# Patient Record
Sex: Male | Born: 1985 | Race: White | Hispanic: No | Marital: Single | Smoking: Former smoker
Health system: Southern US, Community
[De-identification: ages and names within clinical notes are randomized; demographics above are authoritative.]

---

## 2004-11-11 ENCOUNTER — Emergency Department (HOSPITAL_COMMUNITY): Admission: EM | Admit: 2004-11-11 | Discharge: 2004-11-11 | Payer: Self-pay | Admitting: Emergency Medicine

## 2008-12-04 ENCOUNTER — Emergency Department (HOSPITAL_COMMUNITY): Admission: EM | Admit: 2008-12-04 | Discharge: 2008-12-04 | Payer: Self-pay | Admitting: Emergency Medicine

## 2015-04-09 ENCOUNTER — Encounter (HOSPITAL_COMMUNITY): Payer: Self-pay | Admitting: Emergency Medicine

## 2015-04-09 ENCOUNTER — Emergency Department (HOSPITAL_COMMUNITY): Payer: BLUE CROSS/BLUE SHIELD

## 2015-04-09 ENCOUNTER — Emergency Department (HOSPITAL_COMMUNITY)
Admission: EM | Admit: 2015-04-09 | Discharge: 2015-04-09 | Disposition: A | Discharge status: Home / Self Care | Payer: BLUE CROSS/BLUE SHIELD | Attending: Emergency Medicine | Admitting: Emergency Medicine

## 2015-04-09 DIAGNOSIS — R000 Tachycardia, unspecified: Secondary | ICD-10-CM | POA: Insufficient documentation

## 2015-04-09 DIAGNOSIS — R55 Syncope and collapse: Secondary | ICD-10-CM | POA: Diagnosis not present

## 2015-04-09 DIAGNOSIS — R51 Headache: Secondary | ICD-10-CM | POA: Diagnosis not present

## 2015-04-09 DIAGNOSIS — R739 Hyperglycemia, unspecified: Secondary | ICD-10-CM | POA: Diagnosis not present

## 2015-04-09 DIAGNOSIS — E876 Hypokalemia: Secondary | ICD-10-CM | POA: Diagnosis not present

## 2015-04-09 DIAGNOSIS — R42 Dizziness and giddiness: Secondary | ICD-10-CM | POA: Diagnosis present

## 2015-04-09 DIAGNOSIS — Z87891 Personal history of nicotine dependence: Secondary | ICD-10-CM | POA: Diagnosis not present

## 2015-04-09 DIAGNOSIS — R569 Unspecified convulsions: Secondary | ICD-10-CM | POA: Insufficient documentation

## 2015-04-09 LAB — CBG MONITORING, ED: Glucose-Capillary: 74 mg/dL (ref 65–99)

## 2015-04-09 LAB — ACETAMINOPHEN LEVEL: Acetaminophen (Tylenol), Serum: <10 ug/mL — ABNORMAL LOW (ref 10–30)

## 2015-04-09 LAB — CBC
HCT: 44.1 % (ref 39.0–52.0)
HEMOGLOBIN: 15.6 g/dL (ref 13.0–17.0)
MCH: 29.5 pg (ref 26.0–34.0)
MCHC: 35.4 g/dL (ref 30.0–36.0)
MCV: 83.4 fL (ref 78.0–100.0)
Platelets: 257 10*3/uL (ref 150–400)
RBC: 5.29 MIL/uL (ref 4.22–5.81)
RDW: 13 % (ref 11.5–15.5)
WBC: 9.5 10*3/uL (ref 4.0–10.5)

## 2015-04-09 LAB — BASIC METABOLIC PANEL
ANION GAP: 10 (ref 5–15)
BUN: 9 mg/dL (ref 6–20)
CHLORIDE: 103 mmol/L (ref 101–111)
CO2: 25 mmol/L (ref 22–32)
CREATININE: 0.96 mg/dL (ref 0.61–1.24)
Calcium: 9.1 mg/dL (ref 8.9–10.3)
GFR calc Af Amer: >60 mL/min (ref >60)
GFR calc non Af Amer: >60 mL/min (ref >60)
Glucose, Bld: 145 mg/dL — ABNORMAL HIGH (ref 65–99)
POTASSIUM: 3.3 mmol/L — AB (ref 3.5–5.1)
SODIUM: 138 mmol/L (ref 135–145)

## 2015-04-09 LAB — URINALYSIS, ROUTINE W REFLEX MICROSCOPIC
Bilirubin Urine: NEGATIVE
GLUCOSE, UA: NEGATIVE mg/dL
Ketones, ur: NEGATIVE mg/dL
LEUKOCYTES UA: NEGATIVE
NITRITE: NEGATIVE
PH: 6 (ref 5.0–8.0)
Protein, ur: 100 mg/dL — AB
Urobilinogen, UA: 0.2 mg/dL (ref 0.0–1.0)

## 2015-04-09 LAB — RAPID URINE DRUG SCREEN, HOSP PERFORMED
AMPHETAMINES: NOT DETECTED
Barbiturates: NOT DETECTED
Benzodiazepines: NOT DETECTED
COCAINE: NOT DETECTED
OPIATES: NOT DETECTED
TETRAHYDROCANNABINOL: NOT DETECTED

## 2015-04-09 LAB — URINE MICROSCOPIC-ADD ON

## 2015-04-09 LAB — SALICYLATE LEVEL: Salicylate Lvl: <4 mg/dL (ref 2.8–30.0)

## 2015-04-09 LAB — D-DIMER, QUANTITATIVE (NOT AT ARMC)

## 2015-04-09 LAB — TROPONIN I: Troponin I: <0.03 ng/mL (ref <0.031)

## 2015-04-09 MED ORDER — SODIUM CHLORIDE 0.9 % IV BOLUS (SEPSIS)
1000.0000 mL | Freq: Once | INTRAVENOUS | Status: AC
Start: 2015-04-09 — End: 2015-04-09
  Administered 2015-04-09: 1000 mL via INTRAVENOUS

## 2015-04-09 MED ORDER — POTASSIUM CHLORIDE CRYS ER 20 MEQ PO TBCR
40.0000 meq | EXTENDED_RELEASE_TABLET | Freq: Once | ORAL | Status: AC
  Administered 2015-04-09: 40 meq via ORAL
  Filled 2015-04-09: qty 2

## 2015-04-09 MED ORDER — SODIUM CHLORIDE 0.9 % IV BOLUS (SEPSIS)
1000.0000 mL | Freq: Once | INTRAVENOUS | Status: AC
  Administered 2015-04-09: 1000 mL via INTRAVENOUS

## 2015-04-09 NOTE — ED Provider Notes (Addendum)
CSN: [id number]     Arrival date & time 04/09/15  1701 History   First MD Initiated Contact with Patient 04/09/15 1722     Chief Complaint  Patient presents with  . Loss of Consciousness     (Consider location/radiation/quality/duration/timing/severity/associated sxs/prior Treatment) HPI Comments: Patient from home with possible seizure-like episode. Patient states she remembers going to the  bathroom to have a bowel movement. He states he was straining on the toilet when he began to have blurry vision and feel lightheaded. He knows he was laying in the bed in his mother's room. His mother heard him fall in the bathroom and said he was shaking all over for about 10 minutes and was minimally responding to her not following commands. There is no tongue biting or incontinence. No history of seizures. Patient was able to walk to  bedroom on his own.  He was confused initially on EMS arrival. Blood sugar was normal. No history of seizures. Patient recently intentionally lost 80 pounds and has not been eating or drinking very well. He also was prescribed Klonopin which she takes on average 3 or 4 times a week but has not had any for several weeks. Denies any additional drug use or alcohol use. Uncertain if he hit his head. No chest pain or shortness of breath. No abdominal pain.  The history is provided by the patient, the EMS personnel and a caregiver. The history is limited by the condition of the patient.    History reviewed. No pertinent past medical history. History reviewed. No pertinent past surgical history. History reviewed. No pertinent family history. Social History  Substance Use Topics  . Smoking status: Former Games developer  . Smokeless tobacco: None  . Alcohol Use: No    Review of Systems  Constitutional: Negative for fever, activity change and appetite change.  HENT: Negative for congestion and rhinorrhea.   Eyes: Negative for visual disturbance.  Respiratory: Negative for cough,  chest tightness and shortness of breath.   Cardiovascular: Positive for syncope. Negative for chest pain.  Gastrointestinal: Negative for nausea, vomiting and abdominal pain.  Genitourinary: Negative for dysuria, hematuria and testicular pain.  Musculoskeletal: Negative for myalgias, back pain, arthralgias and neck pain.  Skin: Negative for rash.  Neurological: Positive for dizziness, seizures, light-headedness and headaches.  A complete 10 system review of systems was obtained and all systems are negative except as noted in the HPI and PMH.      Allergies  Review of patient's allergies indicates no known allergies.  Home Medications   Prior to Admission medications   Medication Sig Start Date End Date Taking? Authorizing Provider  clonazePAM (KLONOPIN) 0.5 MG tablet Take 0.5 mg by mouth 2 (two) times daily as needed for anxiety.  02/14/15  Yes Historical Provider, MD  L-ARGININE PO Take 1 capsule by mouth daily as needed (periodically takes daily as needed for supplement).   Yes Historical Provider, MD  psyllium (HYDROCIL/METAMUCIL) 95 % PACK Take 1 packet by mouth daily as needed for mild constipation.   Yes Historical Provider, MD   BP 109/73 mmHg  Pulse 81  Temp(Src) 98.4 F (36.9 C) (Oral)  Resp 18  Ht  (1.803 m)  Wt 171 lb (77.565 kg)  BMI 23.86 kg/m2  SpO2 100% Physical Exam  Constitutional: He is oriented to person, place, and time. He appears well-developed and well-nourished. No distress.  HENT:  Head: Normocephalic and atraumatic.  Mouth/Throat: Oropharynx is clear and moist. No oropharyngeal exudate.  Eyes: Conjunctivae  and EOM are normal. Pupils are equal, round, and reactive to light.  Neck: Normal range of motion. Neck supple.  No C spine tenderness  Cardiovascular: Normal rate, normal heart sounds and intact distal pulses.   No murmur heard. tachycardic  Pulmonary/Chest: Effort normal and breath sounds normal. No respiratory distress.  Abdominal: Soft.  There is no tenderness. There is no rebound and no guarding.  Musculoskeletal: Normal range of motion. He exhibits no edema or tenderness.  Neurological: He is alert and oriented to person, place, and time. No cranial nerve deficit. He exhibits normal muscle tone. Coordination normal.  No ataxia on finger to nose bilaterally. No pronator drift. 5/5 strength throughout. CN 2-12 intact.Equal grip strength. Sensation intact.   Skin: Skin is warm.  Psychiatric: He has a normal mood and affect. His behavior is normal.  Nursing note and vitals reviewed.   ED Course  Procedures (including critical care time) Labs Review Labs Reviewed  BASIC METABOLIC PANEL - Abnormal; Notable for the following:    Potassium 3.3 (*)    Glucose, Bld 145 (*)    All other components within normal limits  URINALYSIS, ROUTINE W REFLEX MICROSCOPIC (NOT AT [facility]) - Abnormal; Notable for the following:    Specific Gravity, Urine >1.030 (*)    Hgb urine dipstick TRACE (*)    Protein, ur 100 (*)    All other components within normal limits  ACETAMINOPHEN LEVEL - Abnormal; Notable for the following:    Acetaminophen (Tylenol), Serum <10 (*)    All other components within normal limits  URINE MICROSCOPIC-ADD ON - Abnormal; Notable for the following:    Bacteria, UA FEW (*)    All other components within normal limits  CBC  TROPONIN I  D-DIMER, QUANTITATIVE (NOT AT [facility])  URINE RAPID DRUG SCREEN, HOSP PERFORMED  SALICYLATE LEVEL  CBG MONITORING, ED    Imaging Review Ct Head Wo Contrast  04/09/2015  CLINICAL DATA:  Seizure-like activity followed by loss of consciousness today. EXAM: CT HEAD WITHOUT CONTRAST TECHNIQUE: Contiguous axial images were obtained from the base of the skull through the vertex without intravenous contrast. COMPARISON:  None. FINDINGS: Normal appearing cerebral hemispheres and posterior fossa structures. Normal size and position of the ventricles. No intracranial hemorrhage, mass lesion or CT  evidence of acute infarction. Unremarkable bones and included paranasal sinuses. IMPRESSION: Normal examination. Electronically Signed   By: Beckie SaltsSteven  Reid M.D.   On: 04/09/2015 18:02   I have personally reviewed and evaluated these images and lab results as part of my medical decision-making.   EKG Interpretation   Date/Time:  Monday April 09 2015 17:09:56 EDT Ventricular Rate:  125 PR Interval:  153 QRS Duration: 79 QT Interval:  302 QTC Calculation: 435 R Axis:   76 Text Interpretation:  Sinus tachycardia No previous ECGs available  Confirmed by Emilie Carp  MD, Abdishakur Gottschall [phone]([id number]) on 04/09/2015 5:22:01 PM      MDM   Final diagnoses:  Seizure (HCC)   Syncopal versus seizure episode while on the toilet. Patient is tachycardic. He is denies any chest pain or shortness of breath. His neurological exam is nonfocal.  EKG with sinus tachycardia. No brugada, no prolonged QT.  CT head is normal. Labs show hyperglycemia without DKA. Mild hypokalemia.  Patient given IV fluids. D-dimer is negative. Doubt pulmonary embolism as cause of syncope.  Heart rate has improved to 90s. Patient is back to baseline per family. He is tolerating by mouth and ambulatory. Patient had possible seizure also  had possible episode of syncope. Seizure seems more likely as patient does not recall incident and was initially confused. CT head is negative. Consider Klonopin withdrawal as source of seizure though this seems unlikely as patient has not had Klonopin in 2 weeks and only takes it only at on an as-needed basis. Denies alcohol or drug use.  Suspect first-time seizure. Patient back to baseline. Tolerant by mouth and ambulatory. Heart rate in the 80s. Oriented 3. Discussed seizure precautions including no driving or operating heavy machinery until follows up with neurology. Return precautions discussed.  BP 109/73 mmHg  Pulse 81  Temp(Src) 98.4 F (36.9 C) (Oral)  Resp 18  Ht  (1.803 m)  Wt 171 lb  (77.565 kg)  BMI 23.86 kg/m2  SpO2 100%   Glynn Octave, MD 04/10/15 4098  Glynn Octave, MD 04/10/15 [phone]

## 2015-04-09 NOTE — Discharge Instructions (Signed)
Seizure, Adult Follow-up with the neurologist. Do not drive or operate heavy machinery until he clears you to do these things. Return to the ED if you develop new or worsening symptoms. A seizure is abnormal electrical activity in the brain. Seizures usually last from 30 seconds to 2 minutes. There are various types of seizures. Before a seizure, you may have a warning sensation (aura) that a seizure is about to occur. An aura may include the following symptoms:   Fear or anxiety.  Nausea.  Feeling like the room is spinning (vertigo).  Vision changes, such as seeing flashing lights or spots. Common symptoms during a seizure include:  A change in attention or behavior (altered mental status).  Convulsions with rhythmic jerking movements.  Drooling.  Rapid eye movements.  Grunting.  Loss of bladder and bowel control.  Bitter taste in the mouth.  Tongue biting. After a seizure, you may feel confused and sleepy. You may also have an injury resulting from convulsions during the seizure. HOME CARE INSTRUCTIONS   If you are given medicines, take them exactly as prescribed by your health care provider.  Keep all follow-up appointments as directed by your health care provider.  Do not swim or drive or engage in risky activity during which a seizure could cause further injury to you or others until your health care provider says it is OK.  Get adequate rest.  Teach friends and family what to do if you have a seizure. They should:  Lay you on the ground to prevent a fall.  Put a cushion under your head.  Loosen any tight clothing around your neck.  Turn you on your side. If vomiting occurs, this helps keep your airway clear.  Stay with you until you recover.  Know whether or not you need emergency care. SEEK IMMEDIATE MEDICAL CARE IF:  The seizure lasts longer than 5 minutes.  The seizure is severe or you do not wake up immediately after the seizure.  You have an  altered mental status after the seizure.  You are having more frequent or worsening seizures. Someone should drive you to the emergency department or call local emergency services (911 in [place] MAKE SURE YOU:  Understand these instructions.  Will watch your condition.  Will get help right away if you are not doing well or get worse.   This information is not intended to replace advice given to you by your health care provider. Make sure you discuss any questions you have with your health care provider.   Document Released: 05/23/2000 Document Revised: 06/16/2014 Document Reviewed: 01/05/2013 Elsevier Interactive Patient Education [employer]2016 Elsevier Inc.

## 2015-04-09 NOTE — ED Notes (Addendum)
Per EMS, per pt mother, pt had seizure like episode for a few minutes before loc. poc cbg en route 131. Pt alert and oriented at this time. Pt received 4mg  of zofran en route for nausea. nad noted. Pt reports approx 80 lbs weight loss since quit smoking. Pt reports at time of episode was trying to have BM.

## 2015-04-13 ENCOUNTER — Encounter: Payer: Self-pay | Admitting: Neurology

## 2015-04-13 ENCOUNTER — Ambulatory Visit (INDEPENDENT_AMBULATORY_CARE_PROVIDER_SITE_OTHER): Payer: BLUE CROSS/BLUE SHIELD | Admitting: Neurology

## 2015-04-13 VITALS — BP 126/84 | HR 76 | Ht 72.0 in | Wt 180.0 lb

## 2015-04-13 DIAGNOSIS — R569 Unspecified convulsions: Secondary | ICD-10-CM

## 2015-04-13 DIAGNOSIS — R55 Syncope and collapse: Secondary | ICD-10-CM | POA: Diagnosis not present

## 2015-04-13 NOTE — Patient Instructions (Addendum)
1. Schedule MRI brain with and without contrast 2. Schedule 1-hour sleep-deprived EEG 3. Schedule 2-D echocardiogram 4. As per [place] driving laws, no driving after a seizure until 6 months seizure-free 5. Follow-up after tests  Seizure Precautions: 1. If medication has been prescribed for you to prevent seizures, take it exactly as directed.  Do not stop taking the medicine without talking to your doctor first, even if you have not had a seizure in a long time.   2. Avoid activities in which a seizure would cause danger to yourself or to others.  Don't operate dangerous machinery, swim alone, or climb in high or dangerous places, such as on ladders, roofs, or girders.  Do not drive unless your doctor says you may.  3. If you have any warning that you may have a seizure, lay down in a safe place where you can't hurt yourself.    4.  No driving for 6 months from last seizure, [place].   Please refer to the following link on the Epilepsy Foundation of America's website for more information: http://www.epilepsyfoundation.org/answerplace/Social/driving/drivingu.cfm   5.  Maintain good sleep hygiene. Avoid alcohol  6.  Contact your doctor if you have any problems that may be related to the medicine you are taking.  7.  Call 911 and bring the patient back to the ED if:        A.  The seizure lasts longer than 5 minutes.       B.  The patient doesn't awaken shortly after the seizure  C.  The patient has new problems such as difficulty seeing, speaking or moving  D.  The patient was injured during the seizure  E.  The patient has a temperature over 102 F (39C)  F.  The patient vomited and now is having trouble breathing

## 2015-04-13 NOTE — Progress Notes (Signed)
MRI scheduled for 11/23 @ 8:45, echo scheduled for 11/10 @ 930 (arrival at 845) both at [facility]. Message left for patient.

## 2015-04-13 NOTE — Progress Notes (Signed)
NEUROLOGY CONSULTATION NOTE  Reita ChardCalvin R Cadena MRN: [id number] DOB: May 22, 1986  Referring provider: Dr. Doreen Beamhruv Vyas Primary care provider: Dr. Doreen Beamhruv Vyas  Reason for consult:  New onset seizure  Dear Dr Sherril CroonVyas:  Thank you for your kind referral of Clelia CroftCalvin R Nangle for consultation of the above symptoms. Although his history is well known to you, please allow me to reiterate it for the purpose of our medical record. The patient was accompanied to the clinic by his mother who also provides collateral information. Records and images were personally reviewed where available.  HISTORY OF PRESENT ILLNESS: This is a pleasant 29 year old right-handed man with a history of anxiety, in his usual state of health until 04/09/15 after getting home from work, he recalls going to the bathroom and straining hard having a bowel movement. After he had a BM, he recalls his vision going "funny," he describes it as jerking around, with a fuzzy ball of light everywhere he would look. He may have been dizzy. He recalls feeling bad that day and had been taking a decongestant and Allegra. His mother then heard a strange loud sound from the bathroom, then beating and banging sounds. They came in finding him sitting on the toilet with his pants down, laid back sideways, breathing hard with his eyes rolled back for 5-10 minutes. The bathroom was in disarray, the picture on the wall was off and the toilet seat was actually moved out of it's screwed in position. He stood up after and kept wanting to go out the door, confused for 30-45 minutes after. [facility] where CBC was normal, BMP showed a potassium of 3.3, glucose 145, UDS negative. I personally reviewed head CT without contrast which was unremarkable. He had reported taking clonazepam 3-4 times daily but ran out of medication around 1-1/2 to 2 weeks prior. He was sleep deprived the night prior with only 2 hours of sleep, but states he usually gets only 3-4  hours of sleep on average.  He denies any similar vision changes in the past, no prior syncopal episodes. He denies any olfactory/gustatory hallucinations, deja vu, rising epigastric sensation, focal numbness/tingling/weakness, myoclonic jerks. He denies any headaches, diplopia, dysarthria, dysphagia, neck pain, bladder dysfunction. He has back issues from a bulging disk, and constipation issues. He denies any palpitations or chest pain. No alcohol intake.   Epilepsy Risk Factors:  He had a normal birth and early development.  There is no history of febrile convulsions, CNS infections such as meningitis/encephalitis, significant traumatic brain injury, neurosurgical procedures, or family history of seizures.  PAST MEDICAL HISTORY: Anxiety  PAST SURGICAL HISTORY: No past surgical history on file.  MEDICATIONS: Current Outpatient Prescriptions on File Prior to Visit  Medication Sig Dispense Refill  . clonazePAM (KLONOPIN) 0.5 MG tablet Take 0.5 mg by mouth 2 (two) times daily as needed for anxiety.   2  . L-ARGININE PO Take 1 capsule by mouth daily as needed (periodically takes daily as needed for supplement).    . psyllium (HYDROCIL/METAMUCIL) 95 % PACK Take 1 packet by mouth daily as needed for mild constipation.     No current facility-administered medications on file prior to visit.    ALLERGIES: No Known Allergies  FAMILY HISTORY: No family history on file.  SOCIAL HISTORY: Social History   Social History  . Marital Status: Single    Spouse Name: N/A  . Number of Children: N/A  . Years of Education: N/A   Occupational  History  . Not on file.   Social History Main Topics  . Smoking status: Former Games developer  . Smokeless tobacco: Not on file  . Alcohol Use: No  . Drug Use: No  . Sexual Activity: Not on file   Other Topics Concern  . Not on file   Social History Narrative   Pt lives with parents, does have stairs in home. Pt has some college    REVIEW OF  SYSTEMS: Constitutional: No fevers, chills, or sweats, no generalized fatigue, change in appetite Eyes: No visual changes, double vision, eye pain Ear, nose and throat: No hearing loss, ear pain, nasal congestion, sore throat Cardiovascular: No chest pain, palpitations Respiratory:  No shortness of breath at rest or with exertion, wheezes GastrointestinaI: No nausea, vomiting, diarrhea, abdominal pain, fecal incontinence Genitourinary:  No dysuria, urinary retention or frequency Musculoskeletal:  No neck pain, +back pain Integumentary: No rash, pruritus, skin lesions Neurological: as above Psychiatric: No depression, insomnia, anxiety Endocrine: No palpitations, fatigue, diaphoresis, mood swings, change in appetite, change in weight, increased thirst Hematologic/Lymphatic:  No anemia, purpura, petechiae. Allergic/Immunologic: no itchy/runny eyes, nasal congestion, recent allergic reactions, rashes  PHYSICAL EXAM: Filed Vitals:   04/13/15 1232  BP: 126/84  Pulse: 76   General: No acute distress Head:  Normocephalic/atraumatic Eyes: Fundoscopic exam shows bilateral sharp discs, no vessel changes, exudates, or hemorrhages Neck: supple, no paraspinal tenderness, full range of motion Back: No paraspinal tenderness Heart: regular rate and rhythm Lungs: Clear to auscultation bilaterally. Vascular: No carotid bruits. Skin/Extremities: No rash, no edema Neurological Exam: Mental status: alert and oriented to person, place, and time, no dysarthria or aphasia, Fund of knowledge is appropriate.  Recent and remote memory are intact. 2/3 delayed recall.  Attention and concentration are normal.    Able to name objects and repeat phrases. Cranial nerves: CN I: not tested CN II: pupils equal, round and reactive to light, visual fields intact, fundi unremarkable. CN III, IV, VI:  full range of motion, no nystagmus, no ptosis CN V: facial sensation intact CN VII: upper and lower face symmetric CN  VIII: hearing intact to finger rub CN IX, X: gag intact, uvula midline CN XI: sternocleidomastoid and trapezius muscles intact CN XII: tongue midline Bulk & Tone: normal, no fasciculations. Motor: 5/5 throughout with no pronator drift. Sensation: intact to light touch, cold, pin, vibration and joint position sense.  No extinction to double simultaneous stimulation.  Romberg test negative Deep Tendon Reflexes: +2 throughout, no ankle clonus Plantar responses: downgoing bilaterally Cerebellar: no incoordination on finger to nose, heel to shin. No dysdiadochokinesia Gait: narrow-based and steady, able to tandem walk adequately. Tremor: none  IMPRESSION: This is a pleasant 29 year old right-handed man with a history of anxiety, presenting after an episode concerning for seizure last 04/09/2015. This occurred after he had strained from a bowel movement, raising the possibility of convulsive syncope, however the description of events with post-event confusion is certainly more concerning for seizure. He had been taking clonazepam 3-4 times a day but states he ran out of this 1-1/2 to 2 weeks prior, withdrawal seizure is also a consideration. MRI brain with and without contrast and 1-hour sleep-deprived EEG will be ordered to assess for focal abnormalities that increase risk for recurrent seizures. Echocardiogram will be ordered as well for possible syncope. We discussed that after an initial seizure, unless there are significant risk factors, an abnormal neurological exam, an EEG showing epileptiform abnormalities, and/or abnormal neuroimaging, treatment with an antiepileptic  drug is not indicated. We discussed 10% of the population may have a single seizure. Patients with a single unprovoked seizure have a recurrence rate of 33% after a single seizure and 73% after a second seizure. The patient was instructed to call with recurrent events, as in that case treatment and further diagnostic workup such as  prolonged EEG may be indicated. We discussed [place] driving restrictions which indicate a patient needs to free of seizures or events of altered awareness for 6 months prior to resuming driving. The patient agreed to comply with these restrictions.  Seizure precautions were discussed which include no driving, no bathing in a tub, no swimming alone, no cooking over an open flame, no operating dangerous machinery, and no activities which may endanger oneself or someone else. We discussed avoidance of seizure triggers, including alcohol, sleep deprivation, and sudden discontinuation of clonazepam. He will follow-up after the tests.   Thank you for allowing me to participate in the care of this patient. Please do not hesitate to call for any questions or concerns.   Patrcia Dolly, M.D.  CC: Dr. Sherril Croon

## 2015-04-15 ENCOUNTER — Encounter: Payer: Self-pay | Admitting: Neurology

## 2015-04-15 DIAGNOSIS — R569 Unspecified convulsions: Secondary | ICD-10-CM | POA: Insufficient documentation

## 2015-04-15 DIAGNOSIS — R55 Syncope and collapse: Secondary | ICD-10-CM | POA: Insufficient documentation

## 2015-04-16 ENCOUNTER — Telehealth: Payer: Self-pay | Admitting: *Deleted

## 2015-04-16 NOTE — Telephone Encounter (Signed)
Please review

## 2015-04-16 NOTE — Telephone Encounter (Signed)
Ok to do MRI and echo in January, if they can, would still do EEG. If symptoms change or recur, would do the tests earlier if needed. Thanks

## 2015-04-16 NOTE — Telephone Encounter (Signed)
Patient's mom called to ask if patient could wait until January to have MRI and echo due to insurance reasons.  If not he will have them done now.  Please advise.

## 2015-04-17 NOTE — Telephone Encounter (Signed)
Lmovm to rtn my call. 

## 2015-04-18 ENCOUNTER — Telehealth: Payer: Self-pay | Admitting: Neurology

## 2015-04-18 NOTE — Telephone Encounter (Signed)
Lmovm to rtn my call. 

## 2015-04-18 NOTE — Telephone Encounter (Signed)
Patients' mother/Karen returned my call. Spoke with her about upcoming tests scheduled for patient. Did give her Dr. Rosalyn GessAquino's advisement. Patient will proceed with EEG and have MRI and Echo done at the beginning of the year. I have called [facility] and cancelled those 2 upcoming appts.

## 2015-04-18 NOTE — Telephone Encounter (Signed)
Pt/mother Sean Baldwin/called with questions about the upcoming MRI and Heart [facility]// [phone]

## 2015-04-19 ENCOUNTER — Other Ambulatory Visit (HOSPITAL_COMMUNITY): Payer: BLUE CROSS/BLUE SHIELD

## 2015-05-02 ENCOUNTER — Other Ambulatory Visit (HOSPITAL_COMMUNITY): Payer: BLUE CROSS/BLUE SHIELD

## 2015-05-07 ENCOUNTER — Other Ambulatory Visit: Payer: BLUE CROSS/BLUE SHIELD

## 2015-05-08 ENCOUNTER — Ambulatory Visit: Payer: BLUE CROSS/BLUE SHIELD | Admitting: Neurology

## 2015-05-17 ENCOUNTER — Ambulatory Visit (INDEPENDENT_AMBULATORY_CARE_PROVIDER_SITE_OTHER): Payer: BLUE CROSS/BLUE SHIELD | Admitting: Neurology

## 2015-05-17 DIAGNOSIS — R569 Unspecified convulsions: Secondary | ICD-10-CM | POA: Diagnosis not present

## 2015-05-21 ENCOUNTER — Telehealth: Payer: Self-pay | Admitting: Neurology

## 2015-05-21 NOTE — Telephone Encounter (Signed)
Dee [facility]/ [phone]//called for an [place]

## 2015-05-22 ENCOUNTER — Ambulatory Visit (HOSPITAL_COMMUNITY)
Admission: RE | Admit: 2015-05-22 | Discharge: 2015-05-22 | Disposition: A | Discharge status: Home / Self Care | Payer: BLUE CROSS/BLUE SHIELD | Source: Ambulatory Visit | Attending: Neurology | Admitting: Neurology

## 2015-05-22 DIAGNOSIS — R569 Unspecified convulsions: Secondary | ICD-10-CM | POA: Diagnosis present

## 2015-05-22 DIAGNOSIS — Q898 Other specified congenital malformations: Secondary | ICD-10-CM | POA: Diagnosis not present

## 2015-05-22 DIAGNOSIS — R600 Localized edema: Secondary | ICD-10-CM | POA: Diagnosis not present

## 2015-05-22 MED ORDER — GADOBENATE DIMEGLUMINE 529 MG/ML IV SOLN
15.0000 mL | Freq: Once | INTRAVENOUS | Status: AC | PRN
  Administered 2015-05-22: 15 mL via INTRAVENOUS

## 2015-05-22 NOTE — Telephone Encounter (Signed)
Tried returning calls several times. No answer & no voicemail.

## 2015-05-24 ENCOUNTER — Telehealth: Payer: Self-pay | Admitting: Family Medicine

## 2015-05-24 DIAGNOSIS — R569 Unspecified convulsions: Secondary | ICD-10-CM

## 2015-05-24 DIAGNOSIS — R55 Syncope and collapse: Secondary | ICD-10-CM

## 2015-05-24 NOTE — Telephone Encounter (Signed)
Patient returned my call. I notified him of result. 

## 2015-05-24 NOTE — Telephone Encounter (Signed)
-----   Message from Van ClinesKaren M Aquino, MD sent at 05/24/2015 12:57 PM EST ----- Pls let him know EEG is normal, however since sleep was not captured, would do a longer EEG, pls schedule for 24-hour EEG. Thanks

## 2015-05-24 NOTE — Telephone Encounter (Signed)
-----   Message from Van ClinesKaren M Aquino, MD sent at 05/23/2015  3:43 PM EST ----- Pls let him know I reviewed MRI brain, unremarkable, no evidence of tumor, stroke, or bleed. Thanks

## 2015-05-24 NOTE — Telephone Encounter (Signed)
Lmovm to rtn my call. 

## 2015-05-24 NOTE — Telephone Encounter (Signed)
Patient was notified of result and advisement. He is agreeable to 24 hour eeg. I will put an order in epic and have Darl PikesSusan call the patient to schedule.

## 2015-05-24 NOTE — Procedures (Signed)
ELECTROENCEPHALOGRAM REPORT  Date of Study: 05/17/2015  Patient's Name: Sean Baldwin MRN: [id number] Date of Birth: 1986/06/08  Referring Provider: Dr. Patrcia DollyKaren Quinnley Colasurdo  Clinical History: This is a 29 year old man with new onset seizure.  Medications: Clonazepam, L-arginine  Technical Summary: A multichannel digital EEG recording measured by the international 10-20 system with electrodes applied with paste and impedances below 5000 ohms performed in our laboratory with EKG monitoring in an awake and drowsy patient.  Hyperventilation and photic stimulation were performed.  The digital EEG was referentially recorded, reformatted, and digitally filtered in a variety of bipolar and referential montages for optimal display.    Description: The patient is awake and drowsy during the recording.  During maximal wakefulness, there is a symmetric, medium voltage 10 Hz posterior dominant rhythm that attenuates with eye opening.  The record is symmetric.  There is an excess amount of diffuse low voltage beta activity seen throughout the recording. During drowsiness, there is an increase in theta slowing of the background. Deeper stages of sleep were not seen.  Hyperventilation and photic stimulation did not elicit any abnormalities.  There were no epileptiform discharges or electrographic seizures seen.    EKG lead was unremarkable.  Impression: This awake and drowsy EEG is normal except for excess amount of diffuse low voltage beta activity.  Clinical Correlation: Diffuse low voltage beta activity is commonly seen with sedating medications such as benzodiazepines.  In the absence of sedating medications, anxiety and hyperthyroidism may produce generalized beta activity.  The absence of epileptiform discharges does not exclude a clinical diagnosis of epilepsy.  If further clinical questions remain, prolonged EEG may be helpful.  Clinical correlation is advised.   Patrcia DollyKaren Melquiades Kovar, M.D.

## 2015-05-28 ENCOUNTER — Telehealth: Payer: Self-pay | Admitting: Neurology

## 2015-05-28 NOTE — Telephone Encounter (Signed)
Left message to please call back [phone] and ask for Darl PikesSusan to schedule 24 hour EEG.

## 2015-06-06 ENCOUNTER — Ambulatory Visit (HOSPITAL_COMMUNITY): Payer: BLUE CROSS/BLUE SHIELD

## 2015-06-08 ENCOUNTER — Ambulatory Visit (HOSPITAL_COMMUNITY)
Admission: RE | Admit: 2015-06-08 | Discharge: 2015-06-08 | Disposition: A | Discharge status: Home / Self Care | Payer: BLUE CROSS/BLUE SHIELD | Source: Ambulatory Visit | Attending: Neurology | Admitting: Neurology

## 2015-06-08 DIAGNOSIS — R55 Syncope and collapse: Secondary | ICD-10-CM | POA: Diagnosis present

## 2015-06-12 ENCOUNTER — Other Ambulatory Visit (HOSPITAL_COMMUNITY): Payer: BLUE CROSS/BLUE SHIELD

## 2015-06-15 ENCOUNTER — Telehealth: Payer: Self-pay | Admitting: Family Medicine

## 2015-06-15 NOTE — Telephone Encounter (Signed)
Patient was notified of results. Will cancel his upcoming f/u for 1/11, will proceed with 24 hour eeg. We will see him back for f/u after that has been completed. Will have Darl PikesSusan call him next week to schedule.

## 2015-06-15 NOTE — Telephone Encounter (Signed)
-----   Message from Van ClinesKaren M Aquino, MD sent at 06/13/2015  9:00 AM EST ----- Pls let him know echocardiogram is normal. Would proceed with 24-hour EEG before his f/u with me (currently f/u is scheduled for 1/11). Thanks

## 2015-06-18 ENCOUNTER — Other Ambulatory Visit (HOSPITAL_COMMUNITY): Payer: BLUE CROSS/BLUE SHIELD

## 2015-06-20 ENCOUNTER — Ambulatory Visit: Payer: BLUE CROSS/BLUE SHIELD | Admitting: Neurology

## 2015-06-21 ENCOUNTER — Ambulatory Visit (INDEPENDENT_AMBULATORY_CARE_PROVIDER_SITE_OTHER): Payer: BLUE CROSS/BLUE SHIELD | Admitting: Neurology

## 2015-06-21 DIAGNOSIS — R569 Unspecified convulsions: Secondary | ICD-10-CM

## 2015-06-21 DIAGNOSIS — R55 Syncope and collapse: Secondary | ICD-10-CM

## 2015-06-27 NOTE — Procedures (Signed)
ELECTROENCEPHALOGRAM REPORT  Dates of Recording: 06/21/2015 to 06/22/2015  Patient's Name: Sean Baldwin MRN: [id number] Date of Birth: 07/04/1985  Referring Provider: Dr. Patrcia Dolly  Procedure: 24-hour ambulatory EEG  History: This is a 30 year old man with new onset seizure. EEG for classification.  Medications: clonazepam  Technical Summary: This is a 24-hour multichannel digital EEG recording measured by the international 10-20 system with electrodes applied with paste and impedances below 5000 ohms performed as portable with EKG monitoring.  The digital EEG was referentially recorded, reformatted, and digitally filtered in a variety of bipolar and referential montages for optimal display.    DESCRIPTION OF RECORDING: During maximal wakefulness, the background activity consisted of a symmetric 8.5 Hz posterior dominant rhythm which was reactive to eye opening. The record is symmetric. There is an excess amount of diffuse low voltage beta activity throughout the recording. There were no epileptiform discharges or focal slowing seen in wakefulness.  During the recording, the patient progresses through wakefulness, drowsiness, and Stage 2 sleep.  Again, there were no epileptiform discharges seen.  Events: Patient did not complete diary and did not report any symptoms.  There were no electrographic seizures seen.  EKG lead was unremarkable.  IMPRESSION: This 24-hour ambulatory EEG study is normal except for an excess amount of diffuse low voltage beta activity.  CLINICAL CORRELATION: Diffuse low voltage beta activity is commonly seen with sedating medications such as benzodiazepines. In the absence of sedating medications, anxiety and hyperthyroidism may produce generalized beta activity. The absence of epileptiform discharges does not exclude a clinical diagnosis of epilepsy. Typical episodes were not captured. If further clinical questions remain, inpatient video EEG  monitoring may be helpful.   Patrcia Dolly, M.D.

## 2015-06-29 ENCOUNTER — Telehealth: Payer: Self-pay | Admitting: Family Medicine

## 2015-06-29 NOTE — Telephone Encounter (Signed)
Lmovm to rtn my call. 

## 2015-06-29 NOTE — Telephone Encounter (Signed)
-----   Message from Van Clines, MD sent at 06/29/2015 10:01 AM EST ----- Pls let him know 24-hour EEG is normal. I can see him to discuss results on my EEG time on 1/25 or 2/2 3:30pm (since that patient is not coming in anymore). Thanks

## 2015-07-04 NOTE — Telephone Encounter (Signed)
Patient returned my call. Notified him of results, wasn't able to get him in for a f/u appt until 2/17.

## 2015-07-27 ENCOUNTER — Ambulatory Visit (INDEPENDENT_AMBULATORY_CARE_PROVIDER_SITE_OTHER): Payer: BLUE CROSS/BLUE SHIELD | Admitting: Neurology

## 2015-07-27 ENCOUNTER — Encounter: Payer: Self-pay | Admitting: Neurology

## 2015-07-27 VITALS — BP 122/80 | HR 86 | Ht 71.0 in | Wt 170.0 lb

## 2015-07-27 DIAGNOSIS — R569 Unspecified convulsions: Secondary | ICD-10-CM | POA: Diagnosis not present

## 2015-07-27 DIAGNOSIS — R55 Syncope and collapse: Secondary | ICD-10-CM

## 2015-07-27 NOTE — Patient Instructions (Addendum)
1. Follow-up in 1 year, call for any change in symptoms 2. If you decide to get off clonazepam, slowly taper it off, do not stop suddenly 3. As per [place] driving laws, after an episode of seizure or loss of consciousness, no driving until 6 months event-free

## 2015-07-27 NOTE — Progress Notes (Signed)
NEUROLOGY FOLLOW UP OFFICE NOTE  Sean Baldwin [id number]  HISTORY OF PRESENT ILLNESS: I had the pleasure of seeing Sean Baldwin in follow-up in the neurology clinic on 07/27/2015.  He is again accompanied by his mother who helps supplement the history today. The patient was last seen 3 months ago after a convulsive episode last 04/09/15. Records and images were personally reviewed where available.  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 His routine and 24-hour EEG were normal, however typical events were not captured. Echocardiogram normal. He reports he has been doing well, no further convulsions since October 2016. He denies any headaches, dizziness, olfactory/gustatory hallucinations, focal numbness/tingling/weakness, gaps in time, staring/unresponsive episodes, no falls. His prescription for clonazepam is 1 tab BID, he only takes it once a day on average.  HPI 04/13/15: This is a pleasant 30 yo RH man with a history of anxiety, in his usual state of health until 04/09/15 after getting home from work, he recalls going to the bathroom and straining hard having a bowel movement. After he had a BM, he recalls his vision going "funny," he describes it as jerking around, with a fuzzy ball of light everywhere he would look. He may have been dizzy. He recalls feeling bad that day and had been taking a decongestant and Allegra. His mother then heard a strange loud sound from the bathroom, then beating and banging sounds. They came in finding him sitting on the toilet with his pants down, laid back sideways, breathing hard with his eyes rolled back for 5-10 minutes. The bathroom was in disarray, the picture on the wall was off and the toilet seat was actually moved out of it's screwed in position. He stood up after and kept  wanting to go out the door, confused for 30-45 minutes after. He was brought to [facility] ER where CBC was normal, BMP showed a potassium of 3.3, glucose 145, UDS negative. I personally reviewed head CT without contrast which was unremarkable. He had reported taking clonazepam 3-4 times daily but ran out of medication around 1-1/2 to 2 weeks prior. He was sleep deprived the night prior with only 2 hours of sleep, but states he usually gets only 3-4 hours of sleep on average. He denies any similar vision changes in the past, no prior syncopal episodes. He has back issues from a bulging disk, and constipation issues. He denies any palpitations or chest pain. No alcohol intake.   Epilepsy Risk Factors: He had a normal birth and early development. There is no history of febrile convulsions, CNS infections such as meningitis/encephalitis, significant traumatic brain injury, neurosurgical procedures, or family history of seizures.  PAST MEDICAL HISTORY: No past medical history on file.  MEDICATIONS: Current Outpatient Prescriptions on File Prior to Visit  Medication Sig Dispense Refill  . clonazePAM (KLONOPIN) 0.5 MG tablet Take 0.5 mg by mouth 2 (two) times daily as needed for anxiety.   2   No current facility-administered medications on file prior to visit.    ALLERGIES: No Known Allergies  FAMILY HISTORY: No family history on file.  SOCIAL HISTORY: Social History   Social History  . Marital Status: Single    Spouse Name: N/A  . Number of Children: N/A  . Years of Education: N/A   Occupational History  . Not on file.   Social History Main  Topics  . Smoking status: Former Games developer  . Smokeless tobacco: Not on file  . Alcohol Use: No  . Drug Use: No  . Sexual Activity: Not on file   Other Topics Concern  . Not on file   Social History Narrative   Pt lives with parents, does have stairs in home. Pt has some college    REVIEW OF SYSTEMS: Constitutional: No fevers, chills, or  sweats, no generalized fatigue, change in appetite Eyes: No visual changes, double vision, eye pain Ear, nose and throat: No hearing loss, ear pain, nasal congestion, sore throat Cardiovascular: No chest pain, palpitations Respiratory:  No shortness of breath at rest or with exertion, wheezes GastrointestinaI: No nausea, vomiting, diarrhea, abdominal pain, fecal incontinence Genitourinary:  No dysuria, urinary retention or frequency Musculoskeletal:  No neck pain, +back pain Integumentary: No rash, pruritus, skin lesions Neurological: as above Psychiatric: No depression, insomnia, anxiety Endocrine: No palpitations, fatigue, diaphoresis, mood swings, change in appetite, change in weight, increased thirst Hematologic/Lymphatic:  No anemia, purpura, petechiae. Allergic/Immunologic: no itchy/runny eyes, nasal congestion, recent allergic reactions, rashes  PHYSICAL EXAM: Filed Vitals:   07/27/15 1301  BP: 122/80  Pulse: 86   General: No acute distress Head:  Normocephalic/atraumatic Neck: supple, no paraspinal tenderness, full range of motion Heart:  Regular rate and rhythm Lungs:  Clear to auscultation bilaterally Back: No paraspinal tenderness Skin/Extremities: No rash, no edema Neurological Exam: alert and oriented to person, place, and time. No aphasia or dysarthria. Fund of knowledge is appropriate.  Recent and remote memory are intact.  Attention and concentration are normal.    Able to name objects and repeat phrases. Cranial nerves: Pupils equal, round, reactive to light. Extraocular movements intact with no nystagmus. Visual fields full. Facial sensation intact. No facial asymmetry. Tongue, uvula, palate midline.  Motor: Bulk and tone normal, muscle strength 5/5 throughout with no pronator drift.  Sensation to light touch intact.  No extinction to double simultaneous stimulation.  Deep tendon reflexes 2+ throughout, toes downgoing.  Finger to nose testing intact.  Gait narrow-based  and steady, able to tandem walk adequately.  Romberg negative.  IMPRESSION: This is a pleasant 30 yo RH man with a history of anxiety who had an episode concerning for seizure last 04/09/2015. This occurred after he had strained from a bowel movement, raising the possibility of convulsive syncope, however the description of events with post-event confusion is certainly more concerning for seizure. His MRI brain and 24-hour EEG are normal. He had been taking clonazepam 3-4 times a day but states he ran out of this 1-1/2 to 2 weeks prior, withdrawal seizure is still a consideration. We again discussed that after an initial seizure, unless there are significant risk factors, an abnormal neurological exam, an EEG showing epileptiform abnormalities, and/or abnormal neuroimaging, treatment with an antiepileptic drug is not indicated. We discussed 10% of the population may have a single seizure. Patients with a single unprovoked seizure have a recurrence rate of 33% after a single seizure and 73% after a second seizure. The patient was instructed to call with recurrent events. He is aware of [place] restrictions which indicate a patient needs to free of seizures or events of altered awareness for 6 months prior to resuming driving. We also discussed avoiding sudden discontinuation of clonazepam. He will follow-up in 1 year and knows to call for any changes.   Thank you for allowing me to participate in his care.  Please do not hesitate to call for any  questions or concerns.  The duration of this appointment visit was 25 minutes of face-to-face time with the patient.  Greater than 50% of this time was spent in counseling, explanation of diagnosis, planning of further management, and coordination of care.   Patrcia Dolly, M.D.   CC: Dr. Sherril Croon

## 2015-11-09 ENCOUNTER — Encounter: Payer: Self-pay | Admitting: Neurology

## 2016-08-01 ENCOUNTER — Ambulatory Visit: Payer: BLUE CROSS/BLUE SHIELD | Admitting: Neurology

## 2016-08-13 ENCOUNTER — Encounter: Payer: Self-pay | Admitting: Neurology

## 2016-08-13 ENCOUNTER — Ambulatory Visit (INDEPENDENT_AMBULATORY_CARE_PROVIDER_SITE_OTHER): Payer: Managed Care, Other (non HMO) | Admitting: Neurology

## 2016-08-13 VITALS — BP 110/70 | HR 88 | Ht 71.0 in | Wt 174.0 lb

## 2016-08-13 DIAGNOSIS — R55 Syncope and collapse: Secondary | ICD-10-CM | POA: Diagnosis not present

## 2016-08-13 DIAGNOSIS — R569 Unspecified convulsions: Secondary | ICD-10-CM | POA: Diagnosis not present

## 2016-08-13 NOTE — Progress Notes (Signed)
NEUROLOGY FOLLOW UP OFFICE NOTE  Sean Baldwin [id number]  HISTORY OF PRESENT ILLNESS: I had the pleasure of seeing Sean Baldwin in follow-up in the neurology clinic on 08/13/2016.  He was last seen a year ago after a convulsive episode last 04/09/15. He had strained during a bowel movement and had also ran out of clonazepam a week or 2 before the event. MRI and 24-hour EEG normal, echo normal. He continues to do well with no further convulsions since October 2016. He takes clonazepam BID prn, regularly at night to help with sleep. He denies any headaches, dizziness, olfactory/gustatory hallucinations, focal numbness/tingling/weakness, gaps in time, staring/unresponsive episodes, no falls.  HPI 04/13/15: This is a pleasant 31 yo RH man with a history of anxiety, in his usual state of health until 04/09/15 after getting home from work, he recalls going to the bathroom and straining hard having a bowel movement. After he had a BM, he recalls his vision going "funny," he describes it as jerking around, with a fuzzy ball of light everywhere he would look. He may have been dizzy. He recalls feeling bad that day and had been taking a decongestant and Allegra. His mother then heard a strange loud sound from the bathroom, then beating and banging sounds. They came in finding him sitting on the toilet with his pants down, laid back sideways, breathing hard with his eyes rolled back for 5-10 minutes. The bathroom was in disarray, the picture on the wall was off and the toilet seat was actually moved out of it's screwed in position. He stood up after and kept wanting to go out the door, confused for 30-45 minutes after. He was brought to [facility] ER where CBC was normal, BMP showed a potassium of 3.3, glucose 145, UDS negative. I personally reviewed head CT without contrast which was unremarkable. He had reported taking clonazepam 3-4 times daily but ran out of medication around 1-1/2 to 2 weeks prior. He was  sleep deprived the night prior with only 2 hours of sleep, but states he usually gets only 3-4 hours of sleep on average. He denies any similar vision changes in the past, no prior syncopal episodes. He has back issues from a bulging disk, and constipation issues. He denies any palpitations or chest pain. No alcohol intake.   Epilepsy Risk Factors: He had a normal birth and early development. There is no history of febrile convulsions, CNS infections such as meningitis/encephalitis, significant traumatic brain injury, neurosurgical procedures, or family history of seizures.   Records and images were personally reviewed where available.  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 His routine and 24-hour EEG were normal, however typical events were not captured. Echocardiogram normal.  PAST MEDICAL HISTORY: No past medical history on file.  MEDICATIONS: Current Outpatient Prescriptions on File Prior to Visit  Medication Sig Dispense Refill  . clonazePAM (KLONOPIN) 0.5 MG tablet Take 0.5 mg by mouth 2 (two) times daily as needed for anxiety.   2  . Multiple Vitamin (MULTIVITAMIN) tablet Take 1 tablet by mouth daily.     No current facility-administered medications on file prior to visit.     ALLERGIES: No Known Allergies  FAMILY HISTORY: No family history on file.  SOCIAL HISTORY: Social History   Social History  . Marital status: Single    Spouse name: N/A  .  Number of children: N/A  . Years of education: N/A   Occupational History  . Not on file.   Social History Main Topics  . Smoking status: Former Games developermoker  . Smokeless tobacco: Not on file  . Alcohol use No  . Drug use: No  . Sexual activity: Not on file   Other Topics Concern  . Not on file   Social History Narrative   Pt lives with parents,  does have stairs in home. Pt has some college    REVIEW OF SYSTEMS: Constitutional: No fevers, chills, or sweats, no generalized fatigue, change in appetite Eyes: No visual changes, double vision, eye pain Ear, nose and throat: No hearing loss, ear pain, nasal congestion, sore throat Cardiovascular: No chest pain, palpitations Respiratory:  No shortness of breath at rest or with exertion, wheezes GastrointestinaI: No nausea, vomiting, diarrhea, abdominal pain, fecal incontinence Genitourinary:  No dysuria, urinary retention or frequency Musculoskeletal:  No neck pain, +back pain Integumentary: No rash, pruritus, skin lesions Neurological: as above Psychiatric: No depression, insomnia, anxiety Endocrine: No palpitations, fatigue, diaphoresis, mood swings, change in appetite, change in weight, increased thirst Hematologic/Lymphatic:  No anemia, purpura, petechiae. Allergic/Immunologic: no itchy/runny eyes, nasal congestion, recent allergic reactions, rashes  PHYSICAL EXAM: Vitals:   08/13/16 1608  BP: 110/70  Pulse: 88   General: No acute distress Head:  Normocephalic/atraumatic Neck: supple, no paraspinal tenderness, full range of motion Heart:  Regular rate and rhythm Lungs:  Clear to auscultation bilaterally Back: No paraspinal tenderness Skin/Extremities: No rash, no edema Neurological Exam: alert and oriented to person, place, and time. No aphasia or dysarthria. Fund of knowledge is appropriate.  Recent and remote memory are intact.  Attention and concentration are normal.    Able to name objects and repeat phrases. Cranial nerves: Pupils equal, round, reactive to light. Extraocular movements intact with no nystagmus. Visual fields full. Facial sensation intact. No facial asymmetry. Tongue, uvula, palate midline.  Motor: Bulk and tone normal, muscle strength 5/5 throughout with no pronator drift.  Sensation to light touch intact.  No extinction to double simultaneous stimulation.   Deep tendon reflexes 2+ throughout, toes downgoing.  Finger to nose testing intact.  Gait narrow-based and steady, able to tandem walk adequately.  Romberg negative.  IMPRESSION: This is a pleasant 31 yo RH man with a history of anxiety who had an episode concerning for seizure last 04/09/2015. This occurred after he had strained from a bowel movement, raising the possibility of convulsive syncope, however the description of events with post-event confusion is certainly more concerning for seizure. His MRI brain and 24-hour EEG are normal. He had been taking clonazepam 3-4 times a day but states he ran out of this 1-1/2 to 2 weeks prior, withdrawal seizure is still a consideration. He continues to do well with no further episodes in more than a year, not on any seizure medication. He is aware of [place] driving restrictions which indicate a patient needs to free of seizures or events of altered awareness for 6 months prior to resuming driving. We also again discussed avoiding sudden discontinuation of clonazepam. He will follow-up on a prn basis and knows to call for any changes.   Thank you for allowing me to participate in his care.  Please do not hesitate to call for any questions or concerns.  The duration of this appointment visit was 15 minutes of face-to-face time with the patient.  Greater than 50% of this time was spent in counseling, explanation of  diagnosis, planning of further management, and coordination of care.   Patrcia Dolly, M.D.   CC: Dr. Sherril Croon

## 2016-08-13 NOTE — Patient Instructions (Signed)
You look great! Call for any changes. If symptoms recur, stop driving and no driving until 6 months event-free.

## 2016-08-20 NOTE — Progress Notes (Signed)
Faxed Progress Note to pt's PCP.

## 2016-12-12 IMAGING — CT CT HEAD W/O CM
1 series · 16 of 30 positions shown, 20 images · non-contrast
Comparison: None.

CLINICAL DATA: Seizure-like activity followed by loss of
consciousness today.

EXAM:
CT HEAD WITHOUT CONTRAST
TECHNIQUE: Contiguous axial images were obtained from the base of the skull
through the vertex without intravenous contrast.

[Series 2: headtrauma 4.8 h37s · axial · 0.47mm/px · z∈[+110,+270]mm · 16 of 36 slices shown, 20 images]
[im 2/36  brain]
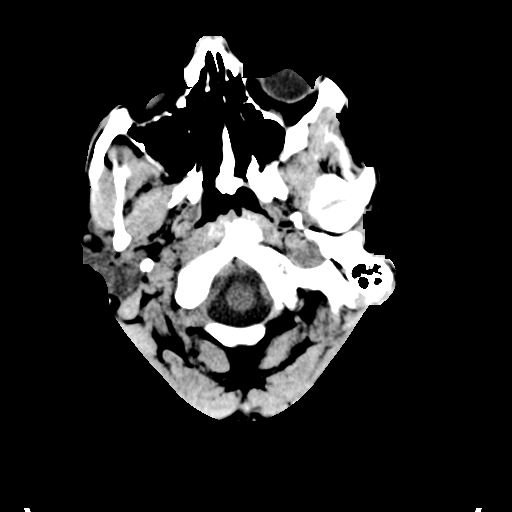
[im 2/36  bone]
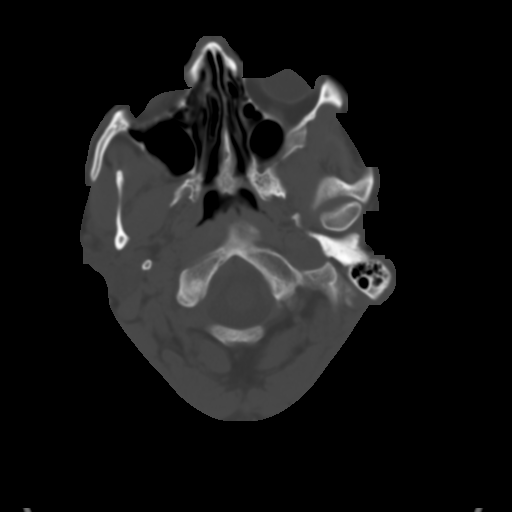
[im 4/36  brain]
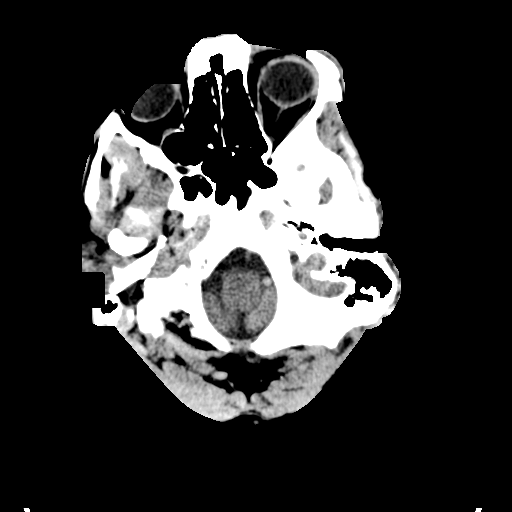
[im 7/36  brain]
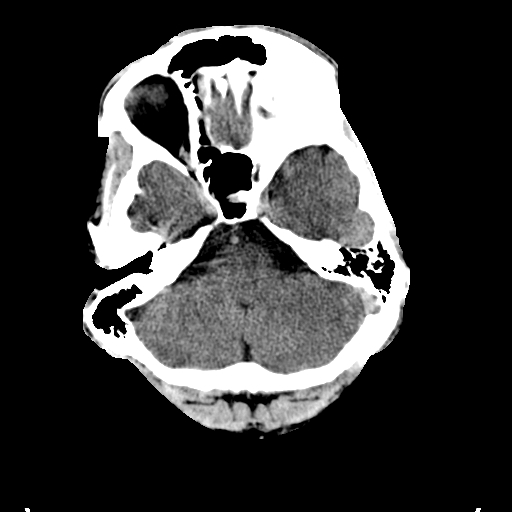
[im 9/36  brain]
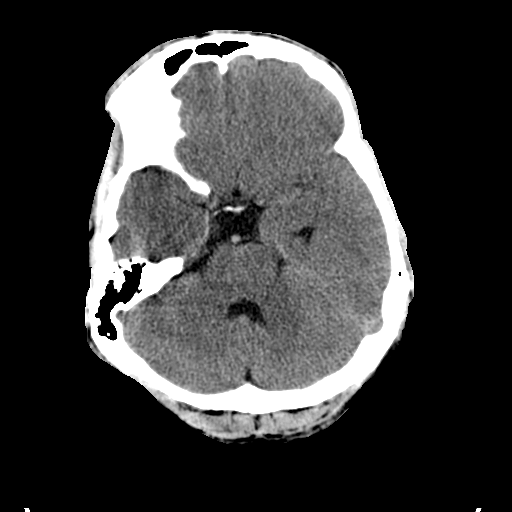
[im 10/36  brain]
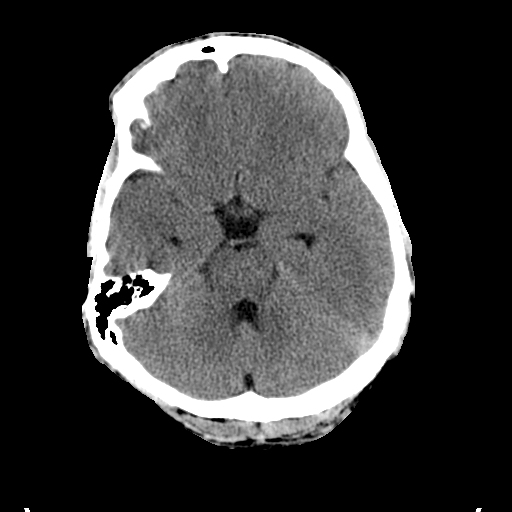
[im 10/36  bone]
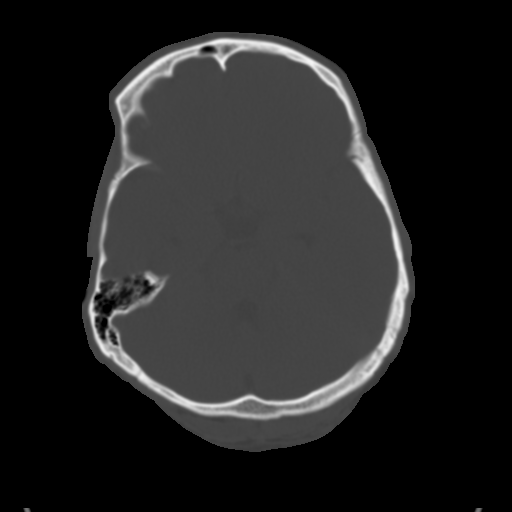
[im 13/36  brain]
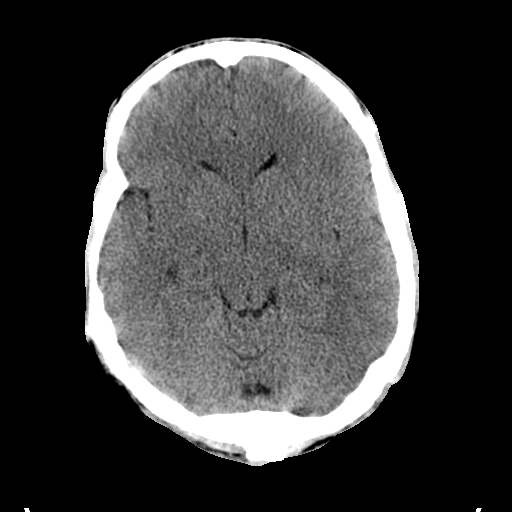
[im 15/36  brain]
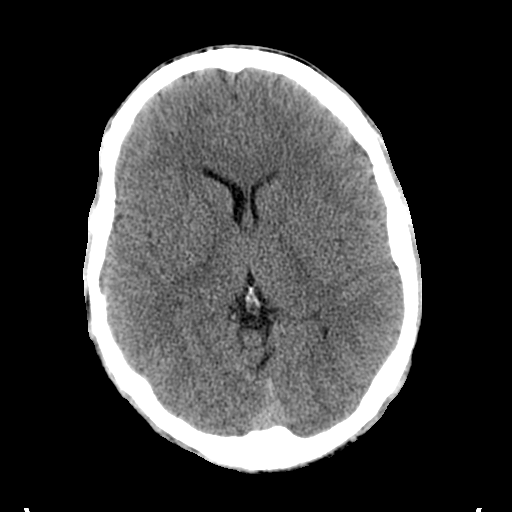
[im 17/36  brain]
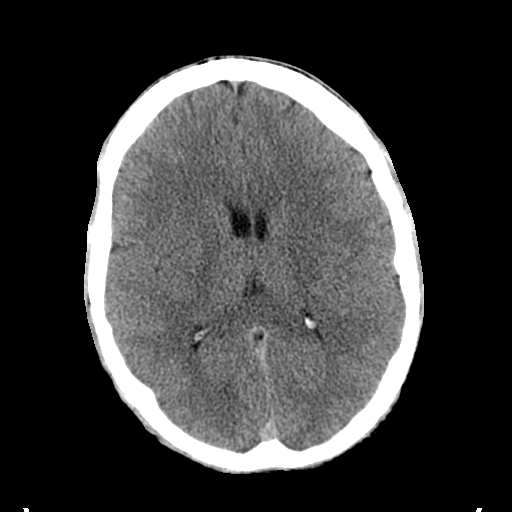
[im 19/36  brain]
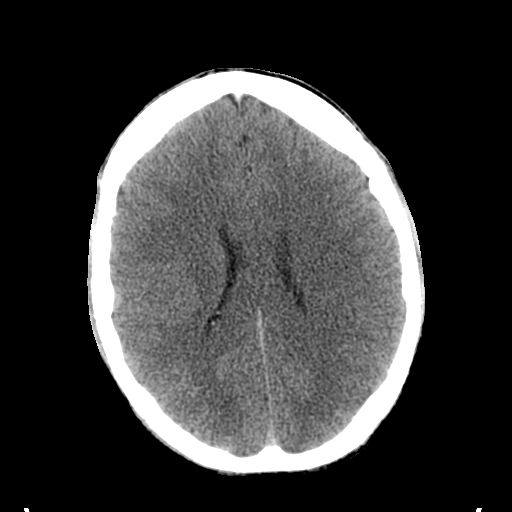
[im 19/36  bone]
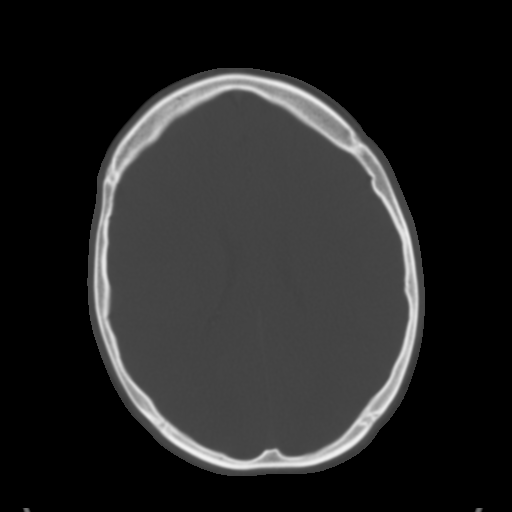
[im 21/36  brain]
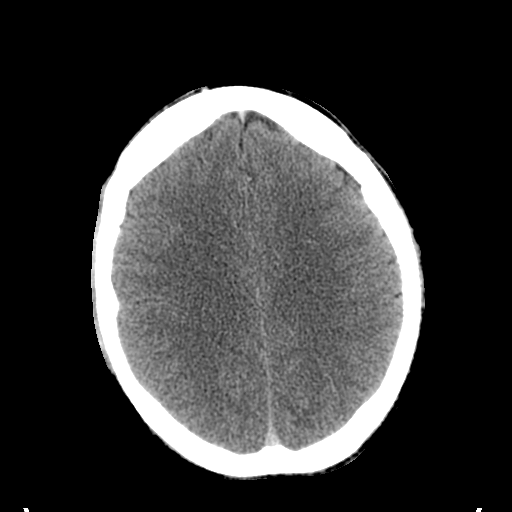
[im 23/36  brain]
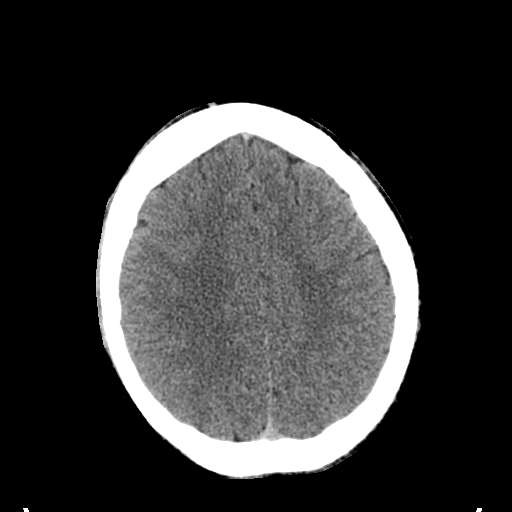
[im 26/36  brain]
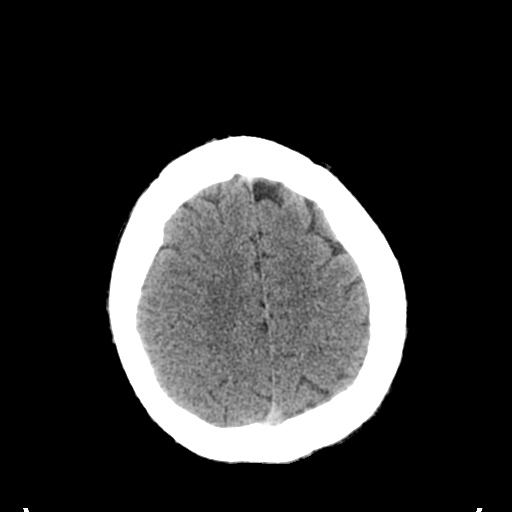
[im 27/36  brain]
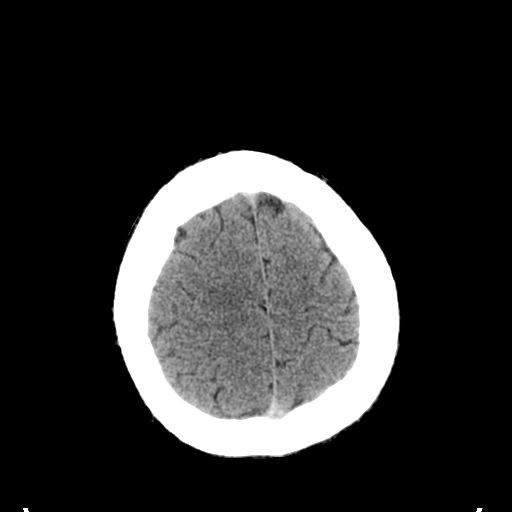
[im 27/36  bone]
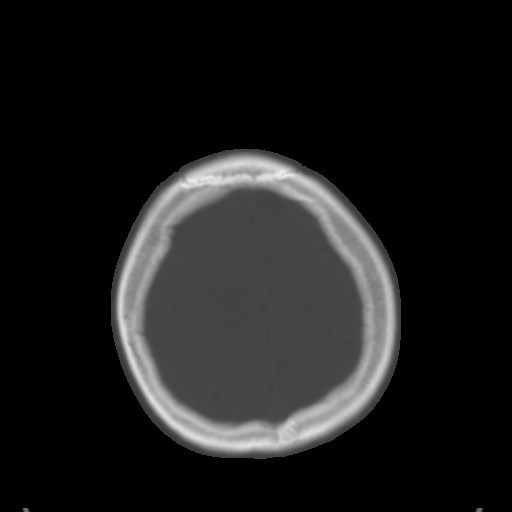
[im 29/36  brain]
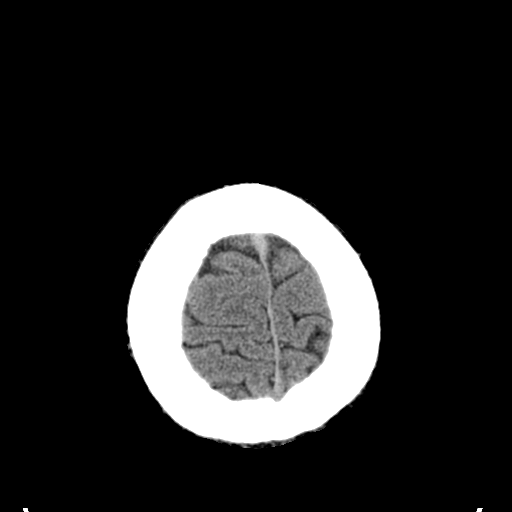
[im 32/36  brain]
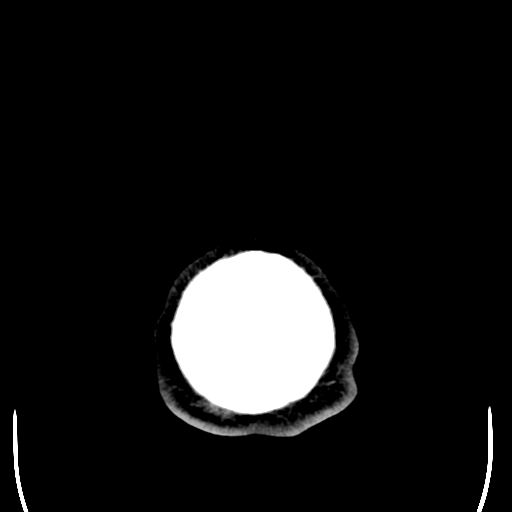
[im 34/36  brain]
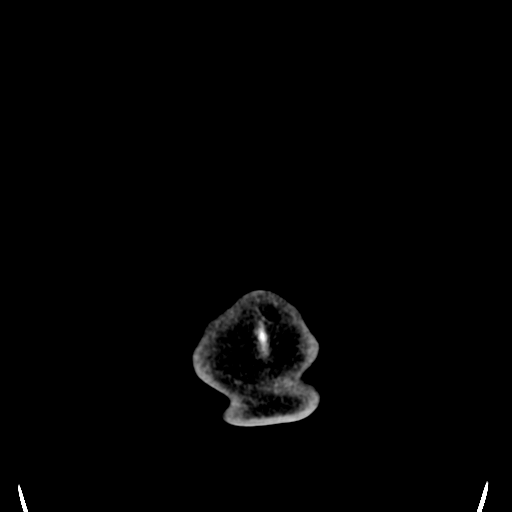

[16 of 30 positions shown; findings below may reference images not displayed]

FINDINGS: Normal appearing cerebral hemispheres and posterior fossa
structures. Normal size and position of the ventricles. No
intracranial hemorrhage, mass lesion or CT evidence of acute
infarction. Unremarkable bones and included paranasal sinuses.
IMPRESSION: Normal examination.

## 2017-01-24 IMAGING — MR MR HEAD WO/W CM
6 of 14 series · 17 of 48 positions shown · IV contrast (multihance)
Comparison: CT head 04/09/2015

CLINICAL DATA: New onset seizure

EXAM:
MRI HEAD WITHOUT AND WITH CONTRAST
TECHNIQUE: Multiplanar, multiecho pulse sequences of the brain and surrounding
structures were obtained without and with intravenous contrast.
CONTRAST:  15mL MULTIHANCE GADOBENATE DIMEGLUMINE 529 MG/ML IV SOLN

[Series 6: T2 · axial · 5.0mm · 0.49mm/px · z∈[-26,+123]mm · 2 of 24 slices shown (1 of 2)]
[im 1/24]
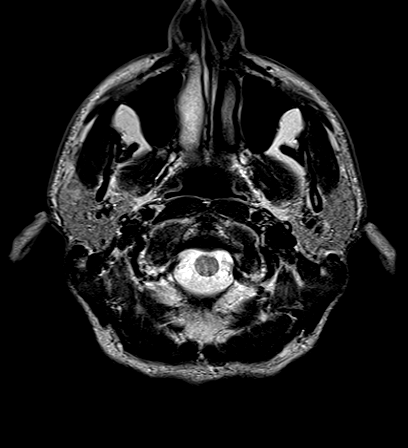
[im 24/24]
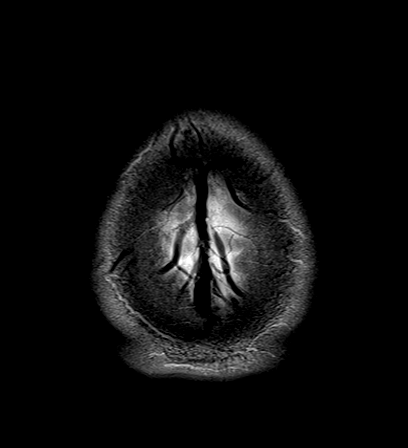

[Series 7: T2 · coronal · 3.0mm · 0.19mm/px · 2 of 40 slices shown (2 of 2)]
[im 1/40]
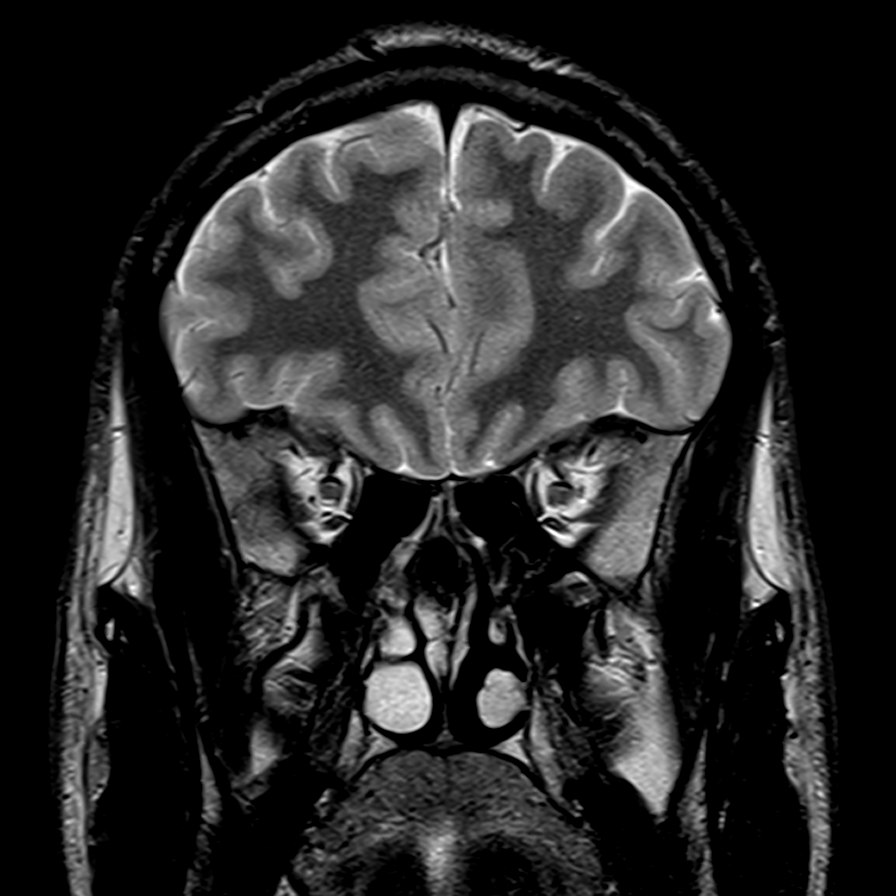
[im 20/40]
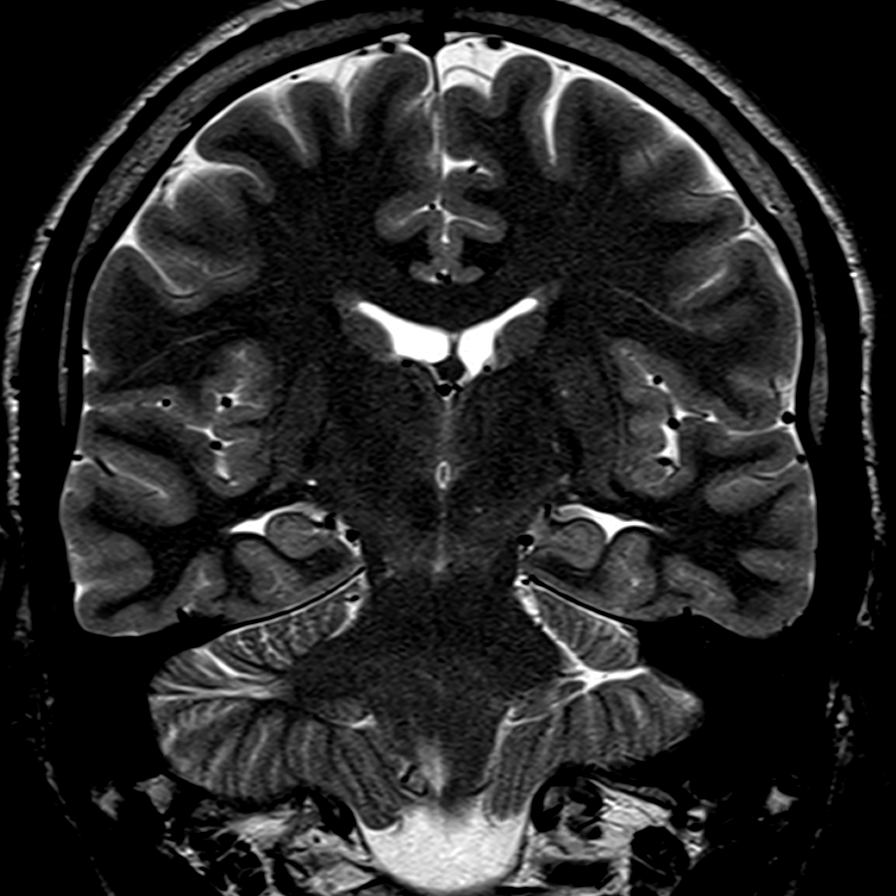

[Series 8: FLAIR · axial · 5.0mm · 0.34mm/px · z∈[-26,+123]mm · 2 of 24 slices shown]
[im 1/24]
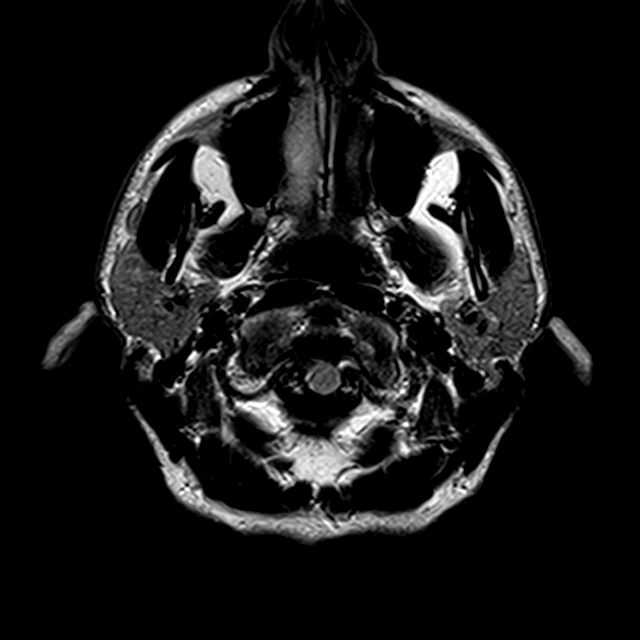
[im 24/24]
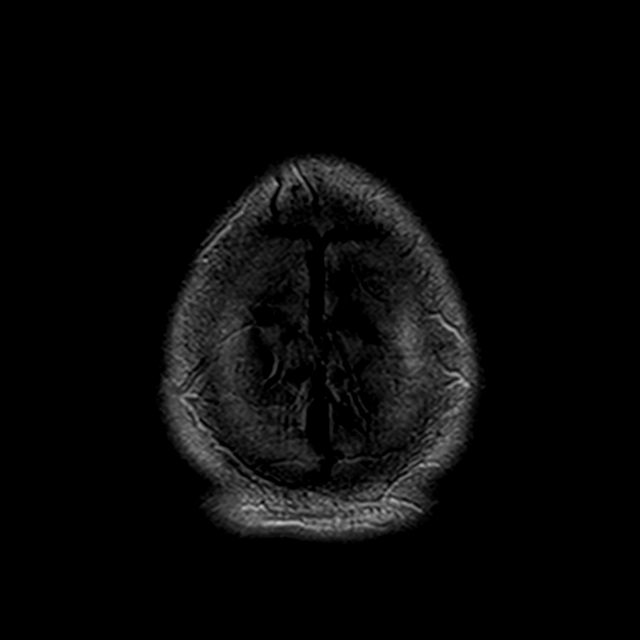

[Series 12: T1 post-contrast · axial · 2.0mm · 0.43mm/px · z∈[-37,+129]mm · 7 of 84 slices shown (1 of 3)]
[im 1/84]
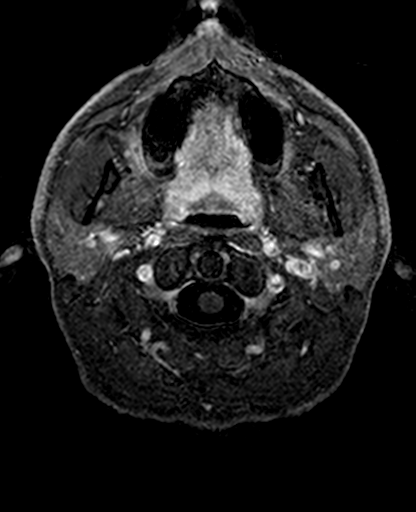
[im 14/84]
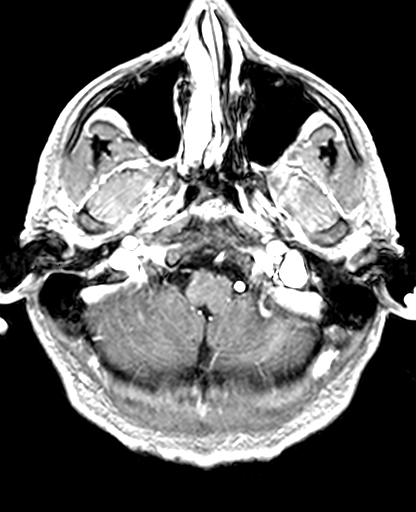
[im 28/84]
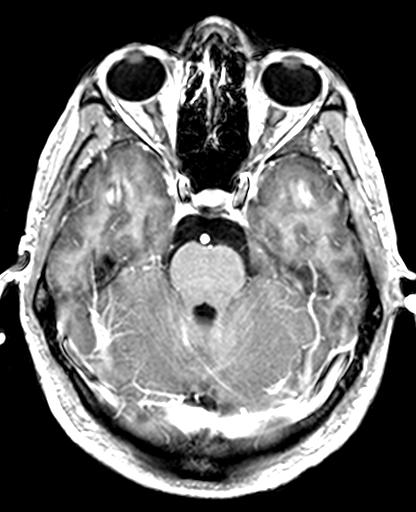
[im 42/84]
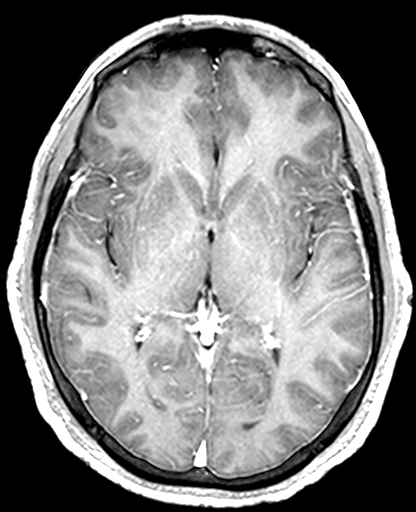
[im 56/84]
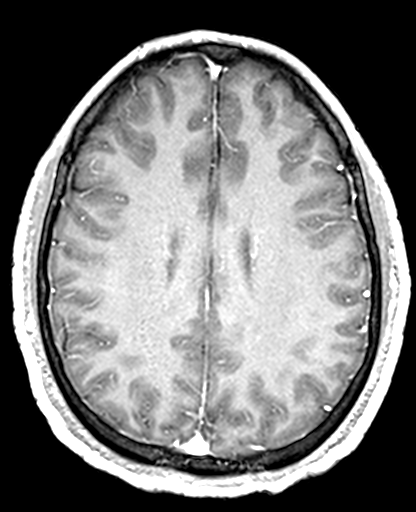
[im 70/84]
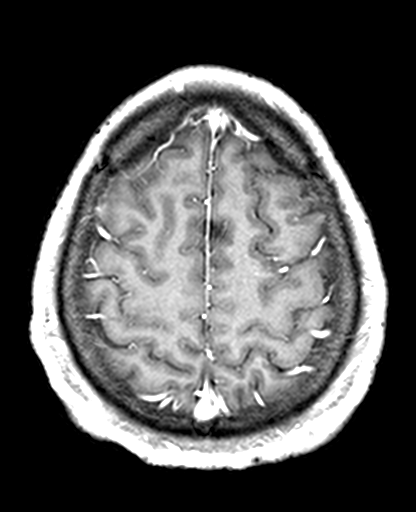
[im 84/84]
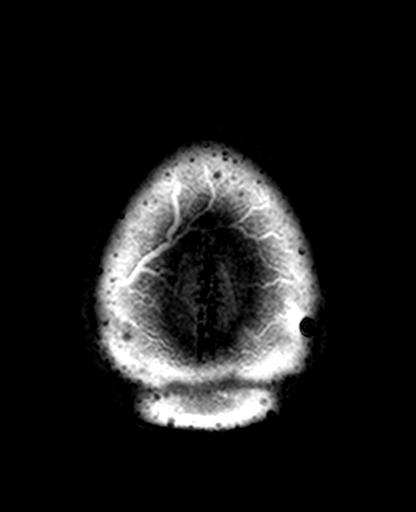

[Series 13: T1 post-contrast · coronal · 5.0mm · 0.40mm/px · 2 of 30 slices shown (2 of 3)]
[im 1/30]
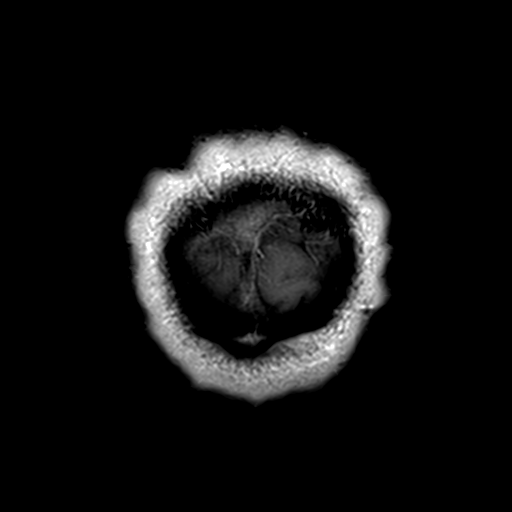
[im 30/30]
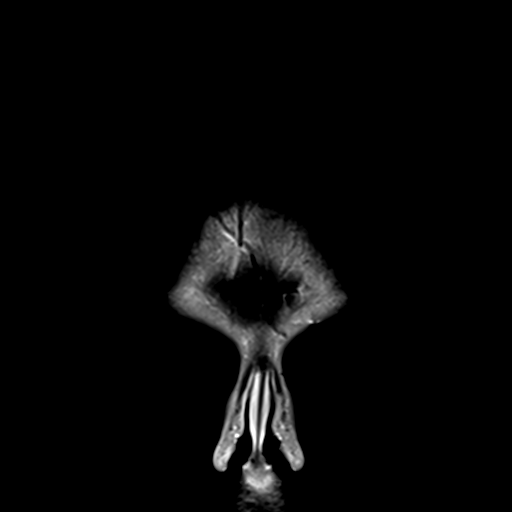

[Series 14: T1 post-contrast · sagittal · 5.0mm · 0.44mm/px · 2 of 21 slices shown (3 of 3)]
[im 1/21]
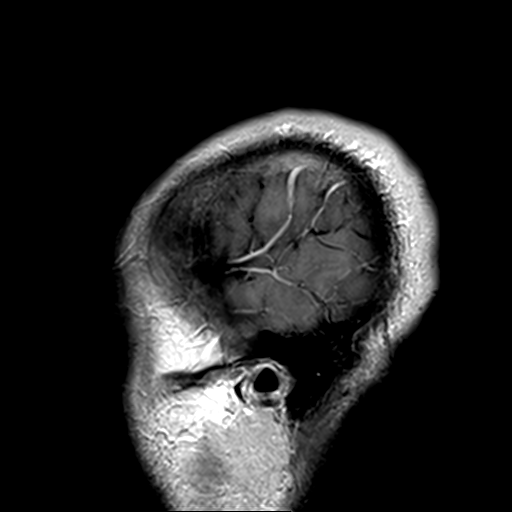
[im 21/21]
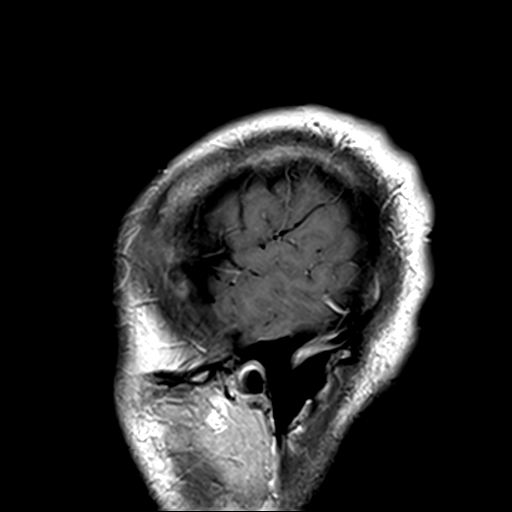

[17 of 48 positions shown; findings below may reference images not displayed]

FINDINGS: Ventricle size is normal.  Cerebral volume is normal.

Negative for acute or chronic infarction

Negative for demyelinating disease. Cerebral white matter is normal.
Basal ganglia brainstem and cerebellum normal.

Negative for intracranial hemorrhage.

Negative for mass or edema.

High-resolution imaging through the temporal lobes reveals normal
signal and volume of the hippocampus. Negative for mesial temporal
sclerosis.

Postcontrast imaging reveals enhancing branching vessel on the left
posterior frontal lobe compatible with small developmental venous
anomaly. No enhancing mass lesion.

Pituitary not enlarged. Mild mucosal edema in the ethmoid sinuses
bilaterally. Normal orbit.
IMPRESSION: Small developmental venous anomaly left frontal lobe, an incidental
finding. Otherwise normal MRI of the brain with contrast

Mild mucosal edema in the ethmoid sinuses.
